# Patient Record
Sex: Male | Born: 1990 | Race: White | Hispanic: No | Marital: Single | Smoking: Current every day smoker
Health system: Southern US, Community
[De-identification: ages and names within clinical notes are randomized; demographics above are authoritative.]

---
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
## 2012-03-18 ENCOUNTER — Emergency Department (HOSPITAL_COMMUNITY)
Admission: EM | Admit: 2012-03-18 | Discharge: 2012-03-18 | Disposition: A | Discharge status: Home / Self Care | Payer: Self-pay | Attending: Emergency Medicine | Admitting: Emergency Medicine

## 2012-03-18 ENCOUNTER — Encounter (HOSPITAL_COMMUNITY): Payer: Self-pay | Admitting: *Deleted

## 2012-03-18 DIAGNOSIS — B86 Scabies: Secondary | ICD-10-CM | POA: Insufficient documentation

## 2012-03-18 DIAGNOSIS — F17200 Nicotine dependence, unspecified, uncomplicated: Secondary | ICD-10-CM | POA: Insufficient documentation

## 2012-03-18 MED ORDER — PERMETHRIN 5 % EX CREA: TOPICAL_CREAM | CUTANEOUS | Status: AC

## 2012-03-18 NOTE — ED Notes (Signed)
Pt reports having rash to body for 2 months, has been itching.

## 2012-03-18 NOTE — ED Provider Notes (Signed)
History     CSN: [id number]  Arrival date & time 03/18/12  1336   First MD Initiated Contact with Patient 03/18/12 1417      Chief Complaint  Patient presents with  . Rash    HPI Joel Simon is a 21 y.o. male who presents to the ED with a rash. The rash started 3 months ago. The rash started between the fingers and has spread. Complains of severe itching.  States his brother has the same rash. His girlfriend had the same rash but went to the doctor and got treated for scabies. Patient denies any other problems.The history was provided by the patient.  History reviewed. No pertinent past medical history.  History reviewed. No pertinent past surgical history.  History reviewed. No pertinent family history.  History  Substance Use Topics  . Smoking status: Current Every Day Smoker  . Smokeless tobacco: Not on file  . Alcohol Use: Yes     rarely      Review of Systems: As stated in HPI  Allergies  Review of patient's allergies indicates no known allergies.  Home Medications  No current outpatient prescriptions on file.  BP 119/74  Pulse 86  Temp 99 F (37.2 C) (Oral)  Resp 18  Ht 6\' 3"  (1.905 m)  Wt 160 lb (72.576 kg)  BMI 20.00 kg/m2  SpO2 99%  Physical Exam  Nursing note and vitals reviewed. Constitutional: He is oriented to person, place, and time. He appears well-developed and well-nourished.  HENT:  Head: Normocephalic.  Eyes: EOM are normal.  Neck: Neck supple.  Cardiovascular: Normal rate.   Pulmonary/Chest: Effort normal.  Musculoskeletal:       See skin exam.  Neurological: He is alert and oriented to person, place, and time.  Skin: Skin is warm and dry. Rash noted.       Linear fine rash noted between fingers, posterior aspect of arms and few area on the back.  Psychiatric: He has a normal mood and affect. His behavior is normal. Judgment and thought content normal.   Assessment: Scabies  Plan:  Elimite   Follow up with Dr. Margo Aye if rash  persist  ED Course  Procedures         [facility], NP 03/18/12 1437

## 2012-03-18 NOTE — ED Notes (Signed)
Itching rash for 3 mos, exposed to scabies

## 2012-03-19 NOTE — ED Provider Notes (Signed)
Medical screening examination/treatment/procedure(s) were performed by non-physician practitioner and as supervising physician I was immediately available for consultation/collaboration.   Samantha Ragen E Elwin Tsou, MD 03/19/12 1439 

## 2012-10-15 ENCOUNTER — Emergency Department (HOSPITAL_COMMUNITY): Payer: Self-pay

## 2012-10-15 ENCOUNTER — Emergency Department (HOSPITAL_COMMUNITY)
Admission: EM | Admit: 2012-10-15 | Discharge: 2012-10-15 | Disposition: A | Discharge status: Home / Self Care | Payer: Self-pay | Attending: Emergency Medicine | Admitting: Emergency Medicine

## 2012-10-15 ENCOUNTER — Encounter (HOSPITAL_COMMUNITY): Payer: Self-pay | Admitting: *Deleted

## 2012-10-15 DIAGNOSIS — S52509A Unspecified fracture of the lower end of unspecified radius, initial encounter for closed fracture: Secondary | ICD-10-CM | POA: Insufficient documentation

## 2012-10-15 DIAGNOSIS — F17200 Nicotine dependence, unspecified, uncomplicated: Secondary | ICD-10-CM | POA: Insufficient documentation

## 2012-10-15 DIAGNOSIS — S5291XA Unspecified fracture of right forearm, initial encounter for closed fracture: Secondary | ICD-10-CM

## 2012-10-15 DIAGNOSIS — Y9289 Other specified places as the place of occurrence of the external cause: Secondary | ICD-10-CM | POA: Insufficient documentation

## 2012-10-15 DIAGNOSIS — S52201A Unspecified fracture of shaft of right ulna, initial encounter for closed fracture: Secondary | ICD-10-CM

## 2012-10-15 DIAGNOSIS — W1789XA Other fall from one level to another, initial encounter: Secondary | ICD-10-CM | POA: Insufficient documentation

## 2012-10-15 DIAGNOSIS — S52609A Unspecified fracture of lower end of unspecified ulna, initial encounter for closed fracture: Secondary | ICD-10-CM | POA: Insufficient documentation

## 2012-10-15 DIAGNOSIS — Y939 Activity, unspecified: Secondary | ICD-10-CM | POA: Insufficient documentation

## 2012-10-15 MED ORDER — HYDROMORPHONE HCL PF 1 MG/ML IJ SOLN
0.5000 mg | Freq: Once | INTRAMUSCULAR | Status: AC
  Administered 2012-10-15: 0.5 mg via INTRAVENOUS
  Filled 2012-10-15: qty 1

## 2012-10-15 MED ORDER — IBUPROFEN 800 MG PO TABS: 800.0000 mg | ORAL_TABLET | Freq: Three times a day (TID) | ORAL | Status: AC

## 2012-10-15 MED ORDER — HYDROCODONE-ACETAMINOPHEN 5-325 MG PO TABS
1.0000 | ORAL_TABLET | Freq: Four times a day (QID) | ORAL | Status: AC | PRN
Start: 2012-10-15 — End: ?

## 2012-10-15 MED ORDER — ONDANSETRON HCL 4 MG/2ML IJ SOLN
4.0000 mg | Freq: Once | INTRAMUSCULAR | Status: AC
  Administered 2012-10-15: 4 mg via INTRAVENOUS
  Filled 2012-10-15: qty 2

## 2012-10-15 MED ORDER — OXYCODONE-ACETAMINOPHEN 5-325 MG PO TABS
1.0000 | ORAL_TABLET | Freq: Once | ORAL | Status: AC
  Administered 2012-10-15: 1 via ORAL
  Filled 2012-10-15: qty 1

## 2012-10-15 NOTE — ED Notes (Signed)
Fell approximately 25 feet down steep embankment.  Stuck R arm out to brace fall. Deformity medial R wrist.

## 2012-10-15 NOTE — ED Provider Notes (Signed)
History  This chart was scribed for Shelda Jakes, MD by Erskine Emery, ED Scribe. This patient was seen in room APA12/APA12 and the patient's care was started at 15:10.   CSN: [id number]  Arrival date & time 10/15/12  1415   First MD Initiated Contact with Patient 10/15/12 1510      Chief Complaint  Patient presents with  . Arm Pain  . Fall    (Consider location/radiation/quality/duration/timing/severity/associated sxs/prior treatment) The history is provided by the patient. No language interpreter was used.  Joel Simon is a 22 y.o. male who presents to the Emergency Department complaining of right medial wrist deformity and 10/10 pain since just PTA when he fell approximately 25 feet down a steep embankment. Pt denies any associated LOC, head pain, neck pain, chest pain, abdominal pain, lower extremity pain, fever, chills, visual changes, cough, rhinorrhea, congestion, SOB, nausea, emesis, diarrhea, dysuria, hematuria, back pain, leg swelling, h/o bleeding easily, rash, headache, weakness or numbness in his extremities, confusion, or previous injury to his right arm. Pt reports some right shoulder pain earlier but claims that has passed. He claims the pain medication he was given upon arrival has helped to relieve his pain considerably.  History reviewed. No pertinent past medical history.  History reviewed. No pertinent past surgical history.  History reviewed. No pertinent family history.  History  Substance Use Topics  . Smoking status: Current Every Day Smoker  . Smokeless tobacco: Not on file  . Alcohol Use: Yes     Comment: rarely      Review of Systems  Constitutional: Negative for fever and chills.  HENT: Negative for congestion, sore throat, rhinorrhea and neck pain.   Eyes: Negative for visual disturbance.  Respiratory: Negative for cough.   Cardiovascular: Negative for chest pain.  Gastrointestinal: Negative for nausea, vomiting, abdominal pain and  diarrhea.  Genitourinary: Negative for dysuria and hematuria.  Musculoskeletal: Negative for back pain and joint swelling.       Right medial wrist deformity and pain.  Skin: Negative for rash.  Neurological: Negative for syncope, weakness, numbness and headaches.  Hematological: Does not bruise/bleed easily.  Psychiatric/Behavioral: Negative for confusion.  All other systems reviewed and are negative.    Allergies  Review of patient's allergies indicates no known allergies.  Home Medications   Current Outpatient Rx  Name  Route  Sig  Dispense  Refill  . HYDROcodone-acetaminophen (NORCO/VICODIN) 5-325 MG per tablet   Oral   Take 1-2 tablets by mouth every 6 (six) hours as needed for pain.   20 tablet   0   . ibuprofen (ADVIL,MOTRIN) 800 MG tablet   Oral   Take 1 tablet (800 mg total) by mouth 3 (three) times daily.   21 tablet   0     Triage Vitals: BP 106/64  Pulse 72  Temp(Src) 99.1 F (37.3 C) (Oral)  Resp 16  Ht 6\' 3"  (1.905 m)  Wt 165 lb (74.844 kg)  BMI 20.62 kg/m2  SpO2 100%  Physical Exam  Nursing note and vitals reviewed. Constitutional: He is oriented to person, place, and time. He appears well-developed and well-nourished. No distress.  HENT:  Head: Normocephalic and atraumatic.  Mouth/Throat: Oropharynx is clear and moist.  Eyes: EOM are normal. Pupils are equal, round, and reactive to light.  Neck: Normal range of motion. Neck supple. No tracheal deviation present.  No neck tenderness.  Cardiovascular: Normal rate, regular rhythm and normal heart sounds.   No murmur heard. Pulmonary/Chest:  Effort normal. No respiratory distress.  Abdominal: Soft. Bowel sounds are normal. He exhibits no distension. There is no tenderness.  Musculoskeletal: Normal range of motion. He exhibits no edema and no tenderness.  Right thumb: capillary refill is 1 second. Brachial pulse is 2+ in right arm, radial pulse is 2+. Swelling over radial and ulnar aspects of right  wrist. Normal sensation. No back tenderness.  Neurological: He is alert and oriented to person, place, and time. No cranial nerve deficit. Coordination normal.  Skin: Skin is warm and dry.  Psychiatric: He has a normal mood and affect.    ED Course  Procedures (including critical care time) DIAGNOSTIC STUDIES: Oxygen Saturation is 100% on room air, normal by my interpretation.    COORDINATION OF CARE: 15:17--I evaluated the patient and we discussed a treatment plan including x-rays and possible follow up with orthopedics to which the pt agreed.    15:41--I rechecked the pt and notified him that he fractured his radius and ulnar bone. Pt has no orthopedic doctor. I notified him that it would take about 6-8 weeks of healing and that he should elevate the arm and wear a sling. I explained that we would write him for some pain medication to use as needed.   Labs Reviewed - No data to display Dg Pelvis 1-2 Views  10/15/2012  *RADIOLOGY REPORT*  Clinical Data: Fall.  PELVIS - 1-2 VIEW  Comparison: None.  Findings: Single view of the pelvis was obtained.  The pelvic bony ring is intact.  Symmetric appearance of the sacroiliac joints.  No gross abnormality to either hip.  IMPRESSION: No acute bony abnormality.   Original Report Authenticated By: Richarda Overlie, M.D.    Dg Wrist Complete Right  10/15/2012  *RADIOLOGY REPORT*  Clinical Data: Fall, wrist pain/swelling  RIGHT WRIST - COMPLETE 3+ VIEW  Comparison: None.  Findings: Comminuted distal radial fracture with intra-articular extension, nondisplaced.  Minimally displaced ulnar styloid fracture.  Associated soft tissue swelling.  IMPRESSION: Distal radial fracture with interarticular extension.  Ulnar styloid fracture.   Original Report Authenticated By: Charline Bills, M.D.    Ct Head Wo Contrast  10/15/2012  *RADIOLOGY REPORT*  Clinical Data:  Fall, neck pain  CT HEAD WITHOUT CONTRAST CT CERVICAL SPINE WITHOUT CONTRAST  Technique:  Multidetector  CT imaging of the head and cervical spine was performed following the standard protocol without intravenous contrast.  Multiplanar CT image reconstructions of the cervical spine were also generated.  Comparison:  None.  CT HEAD  Findings: No evidence of parenchymal hemorrhage or extra-axial fluid collection. No mass lesion, mass effect, or midline shift.  No CT evidence of acute infarction.  Cerebral volume is age appropriate.  No ventriculomegaly.  The visualized paranasal sinuses are essentially clear. The mastoid air cells are unopacified.  No evidence of calvarial fracture.  IMPRESSION: Normal head CT.  CT CERVICAL SPINE  Findings: Normal cervical lordosis.  No evidence of fracture dislocation.  Vertebral body heights and intervertebral disc spaces are maintained.  Dens appears intact.  No prevertebral soft tissue swelling.  Visualized thyroid is unremarkable.  Visualized lung apices are clear.  IMPRESSION: Normal cervical spine CT.   Original Report Authenticated By: Charline Bills, M.D.    Ct Cervical Spine Wo Contrast  10/15/2012  *RADIOLOGY REPORT*  Clinical Data:  Fall, neck pain  CT HEAD WITHOUT CONTRAST CT CERVICAL SPINE WITHOUT CONTRAST  Technique:  Multidetector CT imaging of the head and cervical spine was performed following the standard protocol without  intravenous contrast.  Multiplanar CT image reconstructions of the cervical spine were also generated.  Comparison:  None.  CT HEAD  Findings: No evidence of parenchymal hemorrhage or extra-axial fluid collection. No mass lesion, mass effect, or midline shift.  No CT evidence of acute infarction.  Cerebral volume is age appropriate.  No ventriculomegaly.  The visualized paranasal sinuses are essentially clear. The mastoid air cells are unopacified.  No evidence of calvarial fracture.  IMPRESSION: Normal head CT.  CT CERVICAL SPINE  Findings: Normal cervical lordosis.  No evidence of fracture dislocation.  Vertebral body heights and intervertebral  disc spaces are maintained.  Dens appears intact.  No prevertebral soft tissue swelling.  Visualized thyroid is unremarkable.  Visualized lung apices are clear.  IMPRESSION: Normal cervical spine CT.   Original Report Authenticated By: Charline Bills, M.D.      1. Radius fracture, right, closed, initial encounter   2. Ulnar fracture, right, closed, initial encounter       MDM  X-rays negative except for the right forearm with a distal radius fracture and an ulnar styloid fracture. Will splint with a sugar tong splint in the ED provided sling recommend elevation and referral to orthopedics Dr. Hilda Lias is on-call. Other x-rays were negative also on physical exam no other concerns for any significant injury.      I personally performed the services described in this documentation, which was scribed in my presence. The recorded information has been reviewed and is accurate.     Shelda Jakes, MD 10/15/12 1550

## 2013-09-06 IMAGING — CT CT CERVICAL SPINE W/O CM
4 of 5 series · 15 of 33 positions shown, 17 images · non-contrast
Comparison: None.

CT HEAD

CLINICAL DATA: Fall, neck pain

CT HEAD WITHOUT CONTRAST
CT CERVICAL SPINE WITHOUT CONTRAST
TECHNIQUE: Multidetector CT imaging of the head and cervical spine
was performed following the standard protocol without intravenous
contrast.  Multiplanar CT image reconstructions of the cervical
spine were also generated.

[Series 5: cervical st 2.0 b31s · axial · 0.36mm/px · z∈[-48,+66]mm · 4 of 95 slices shown, 5 images]
[im 19/95  soft-tissue]
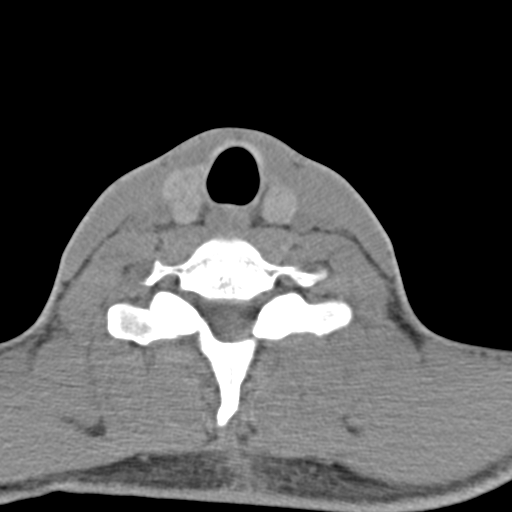
[im 19/95  bone]
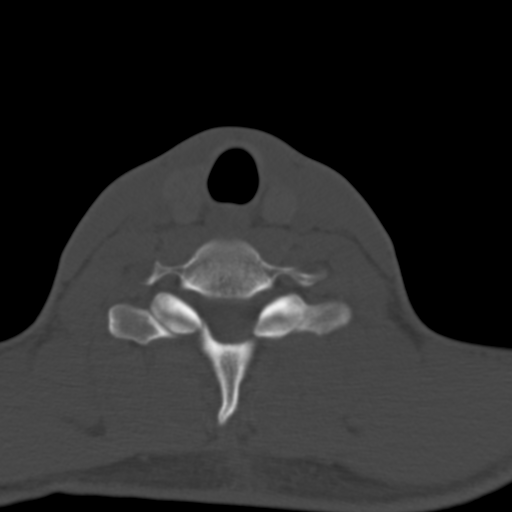
[im 38/95  bone]
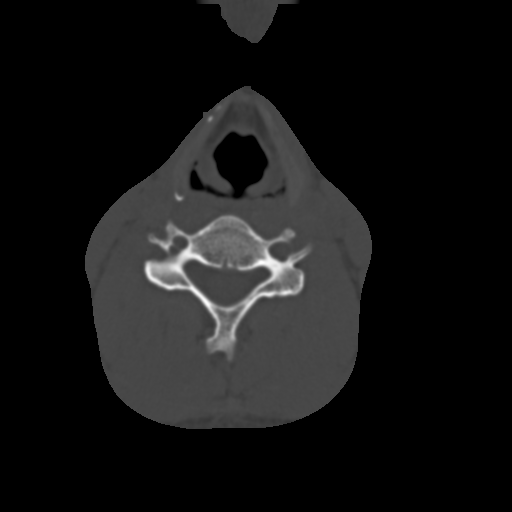
[im 57/95  bone]
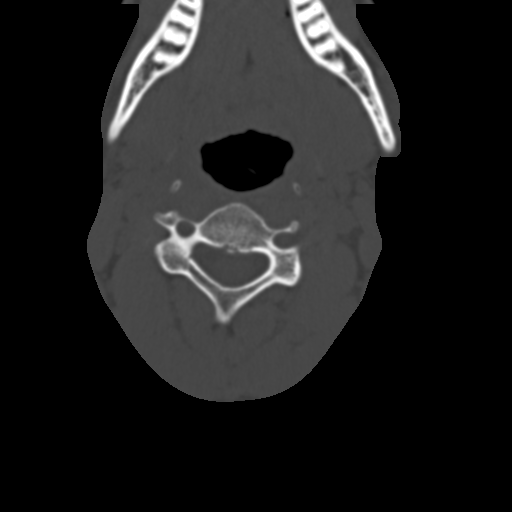
[im 76/95  bone]
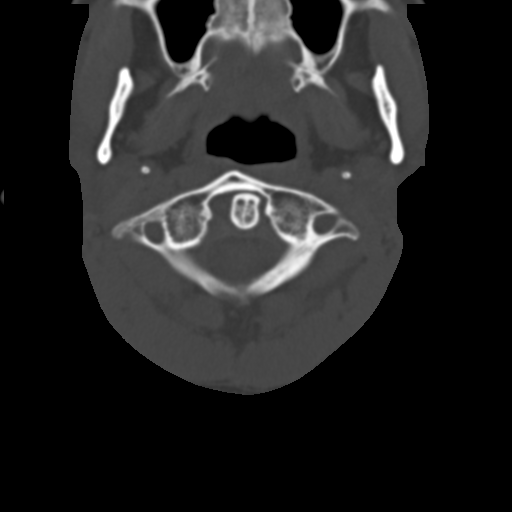

[Series 7: sagittal bone 2.0 · sagittal · 0.31mm/px · 5 of 60 slices shown, 6 images]
[im 20/60  bone]
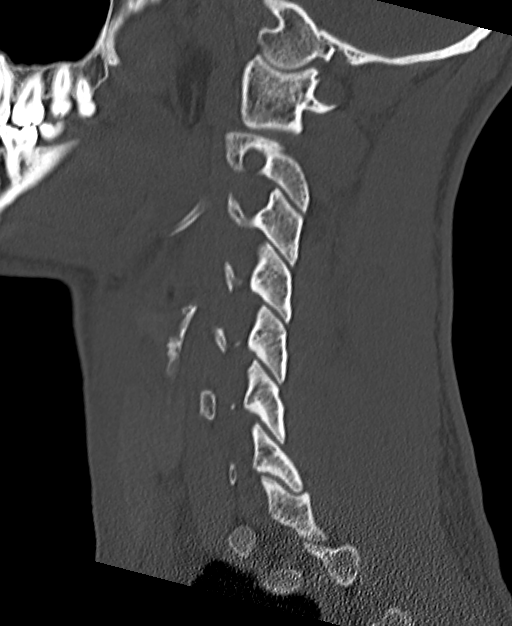
[im 25/60  bone]
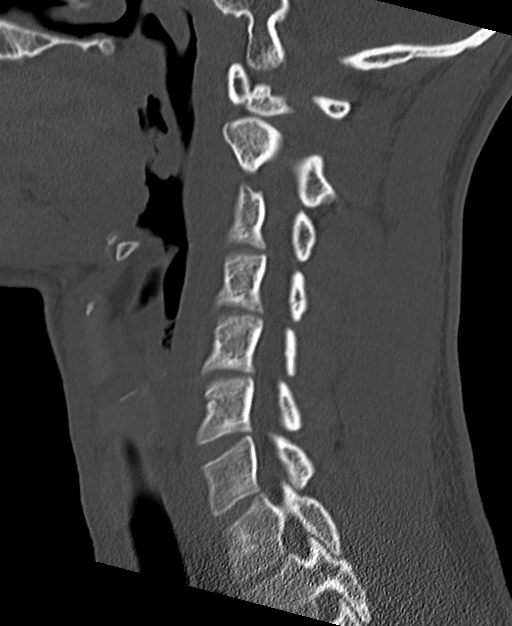
[im 30/60  soft-tissue]
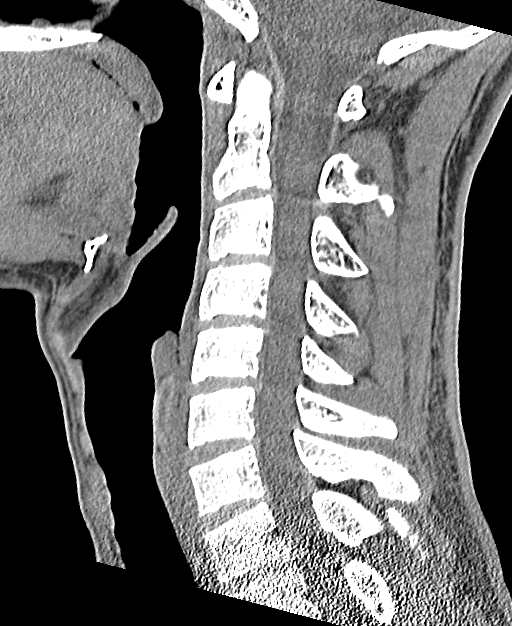
[im 30/60  bone]
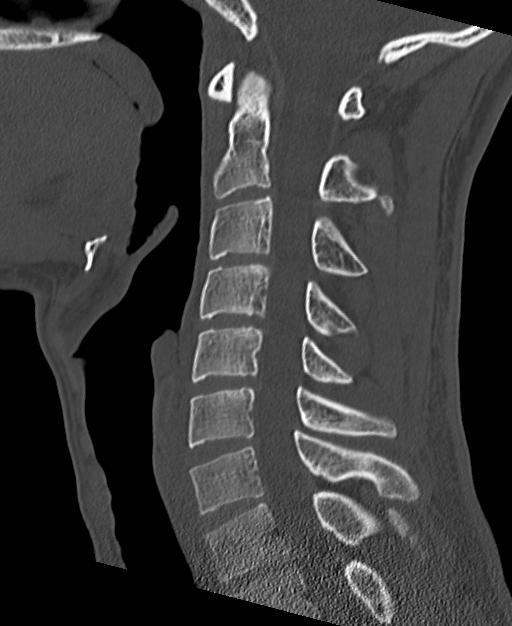
[im 35/60  bone]
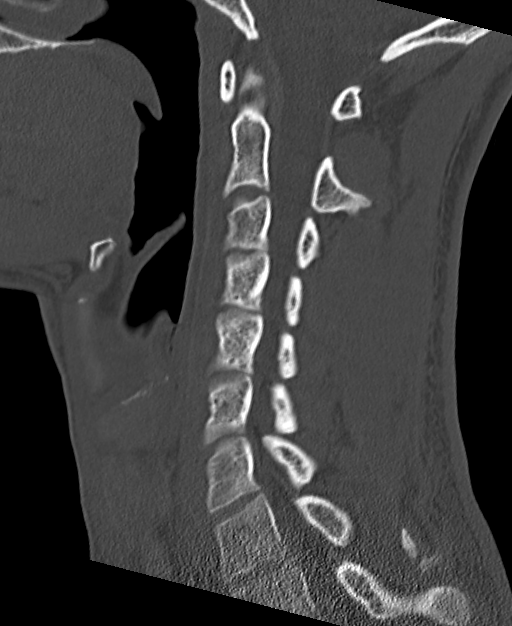
[im 40/60  bone]
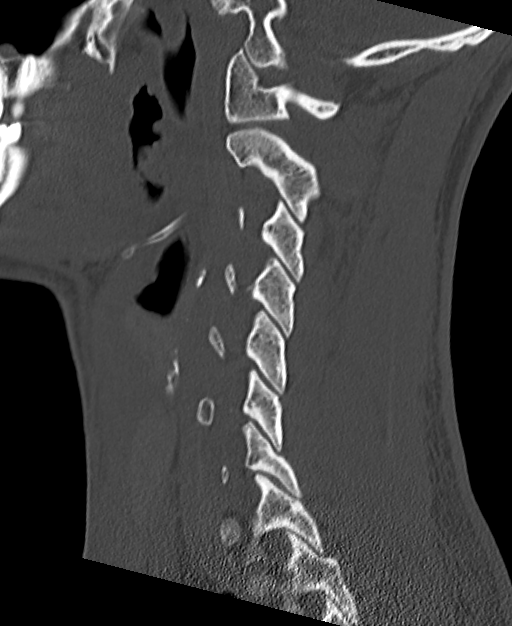

[Series 8: coronal bone 2.0 · coronal · 0.39mm/px · 3 of 79 slices shown]
[im 17/79  bone]
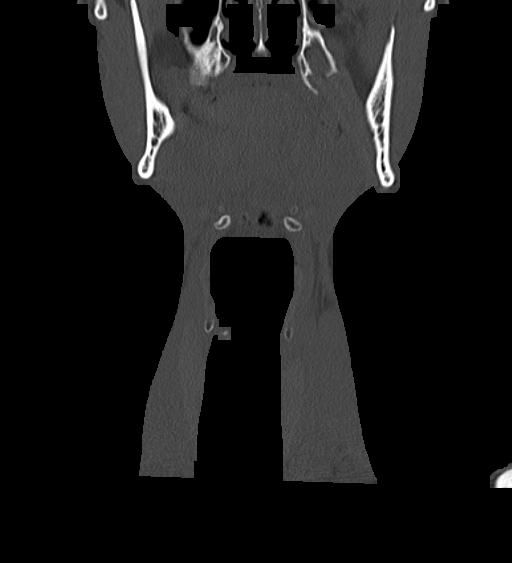
[im 32/79  bone]
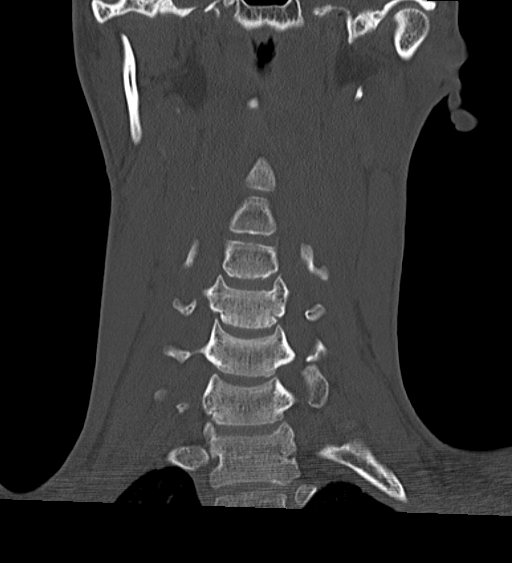
[im 47/79  bone]
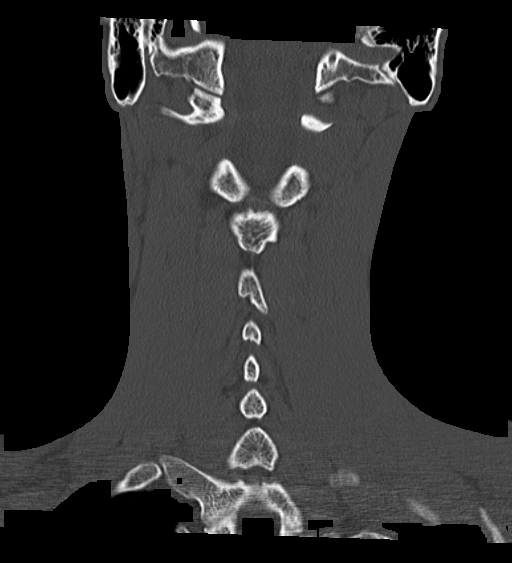

[Series 9: axial bone 2.0 · axial · 0.22mm/px · z∈[-63,+12]mm · 3 of 99 slices shown]
[im 20/99  bone]
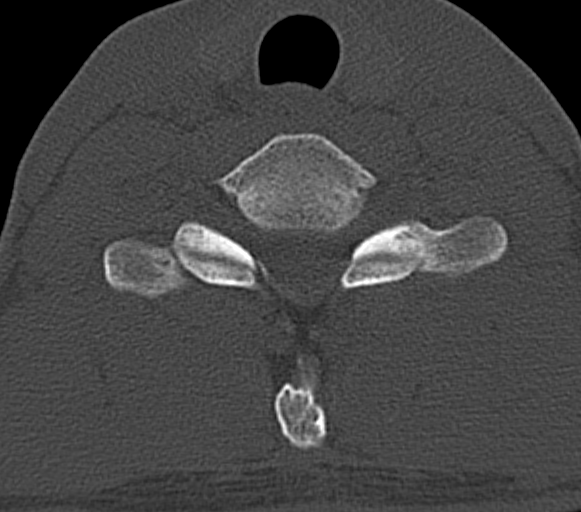
[im 40/99  bone]
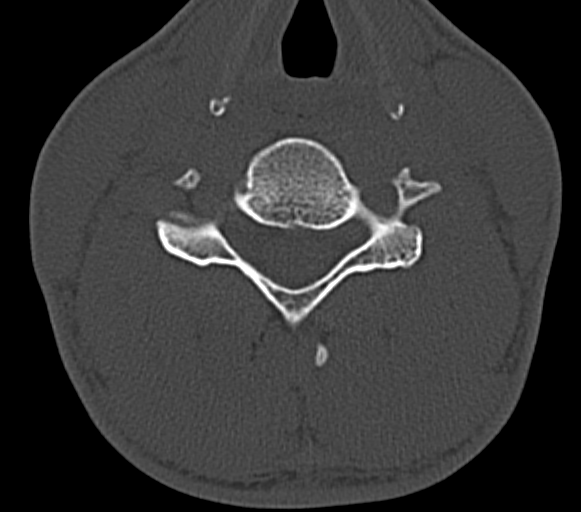
[im 59/99  bone]
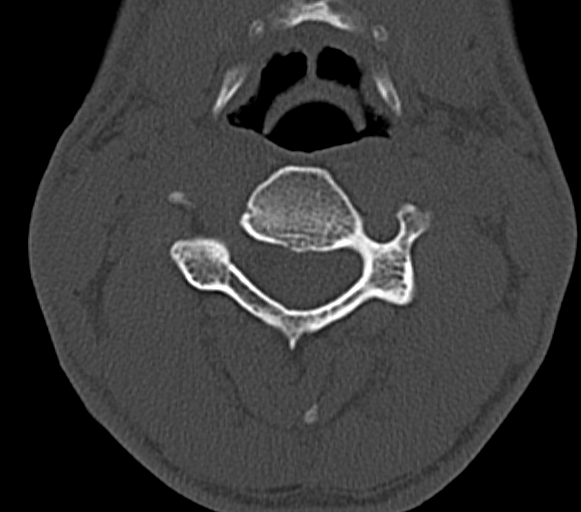

[15 of 33 positions shown; findings below may reference images not displayed]

FINDINGS: No evidence of parenchymal hemorrhage or extra-axial
fluid collection. No mass lesion, mass effect, or midline shift.

No CT evidence of acute infarction.

Cerebral volume is age appropriate.  No ventriculomegaly.

The visualized paranasal sinuses are essentially clear. The mastoid
air cells are unopacified.

No evidence of calvarial fracture.
IMPRESSION: Normal head CT.

CT CERVICAL SPINE
FINDINGS: Normal cervical lordosis.

No evidence of fracture dislocation.  Vertebral body heights and
intervertebral disc spaces are maintained.  Dens appears intact.

No prevertebral soft tissue swelling.

Visualized thyroid is unremarkable.

Visualized lung apices are clear.
IMPRESSION: Normal cervical spine CT.

## 2014-05-29 ENCOUNTER — Encounter (HOSPITAL_COMMUNITY): Payer: Self-pay | Admitting: Emergency Medicine

## 2014-05-29 ENCOUNTER — Emergency Department (HOSPITAL_COMMUNITY)
Admission: EM | Admit: 2014-05-29 | Discharge: 2014-05-29 | Disposition: A | Discharge status: Home / Self Care | Payer: Self-pay | Attending: Emergency Medicine | Admitting: Emergency Medicine

## 2014-05-29 DIAGNOSIS — Z720 Tobacco use: Secondary | ICD-10-CM | POA: Insufficient documentation

## 2014-05-29 DIAGNOSIS — B86 Scabies: Secondary | ICD-10-CM | POA: Insufficient documentation

## 2014-05-29 MED ORDER — DIPHENHYDRAMINE HCL 25 MG PO CAPS
25.0000 mg | ORAL_CAPSULE | Freq: Once | ORAL | Status: AC
  Administered 2014-05-29: 25 mg via ORAL
  Filled 2014-05-29: qty 1

## 2014-05-29 MED ORDER — DIPHENHYDRAMINE HCL 25 MG PO TABS: 25.0000 mg | ORAL_TABLET | ORAL | Status: AC | PRN

## 2014-05-29 MED ORDER — PERMETHRIN 5 % EX CREA: TOPICAL_CREAM | CUTANEOUS | Status: AC

## 2014-05-29 MED ORDER — PREDNISONE 50 MG PO TABS
60.0000 mg | ORAL_TABLET | Freq: Once | ORAL | Status: AC
  Administered 2014-05-29: 60 mg via ORAL
  Filled 2014-05-29 (×2): qty 1

## 2014-05-29 NOTE — Care Management Note (Signed)
ED/CM noted patient did not have health insurance and/or PCP listed in the computer.  Patient was given the [place] resource handout with information on the clinics, food pantries, and the handout for new health insurance sign-up. Pt was also given a Rx discount card. Patient expressed appreciation for information received. 

## 2014-05-29 NOTE — ED Notes (Signed)
Patient c/o rash to arms, neck, legs, and feet. Per patient itching. Denies any fevers. Per patient using coconut oil with no relief. Patient denies any new medications, food, soaps, or detergents. Per patient when it first started was donating plasma.

## 2014-05-29 NOTE — Discharge Instructions (Signed)

## 2014-05-30 LAB — RPR

## 2014-06-01 NOTE — ED Provider Notes (Signed)
CSN: [id number]     Arrival date & time 05/29/14  1157 History   First MD Initiated Contact with Patient 05/29/14 1248     Chief Complaint  Patient presents with  . Rash     (Consider location/radiation/quality/duration/timing/severity/associated sxs/prior Treatment) HPI  Joel Simon is a 23 y.o. male who presents to the Emergency Department complaining of rash of nearly one month.  He states the rash appears to be worsening.  He has been using OTC anti-itch medications w/o relief.  He states that his friend also has similar symptoms.  He denies new medications or foods, fever, swelling, difficulty swallowing or breathing.,   History reviewed. No pertinent past medical history. History reviewed. No pertinent past surgical history. History reviewed. No pertinent family history. History  Substance Use Topics  . Smoking status: Current Every Day Smoker -- 0.75 packs/day for 3 years    Types: Cigarettes  . Smokeless tobacco: Never Used  . Alcohol Use: Yes     Comment: rarely    Review of Systems  Constitutional: Negative for fever, chills, activity change and appetite change.  HENT: Negative for facial swelling, sore throat and trouble swallowing.   Respiratory: Negative for cough, chest tightness, shortness of breath and wheezing.   Musculoskeletal: Negative for neck pain and neck stiffness.  Skin: Positive for rash. Negative for wound.  Neurological: Negative for dizziness, weakness, numbness and headaches.  All other systems reviewed and are negative.     Allergies  Review of patient's allergies indicates no known allergies.  Home Medications   Prior to Admission medications   Medication Sig Start Date End Date Taking? Authorizing Provider  diphenhydrAMINE (BENADRYL) 25 MG tablet Take 1 tablet (25 mg total) by mouth every 4 (four) hours as needed for itching. 05/29/14   Denilson Salminen L. Lessie Funderburke, PA-C  HYDROcodone-acetaminophen (NORCO/VICODIN) 5-325 MG per tablet Take  1-2 tablets by mouth every 6 (six) hours as needed for pain. 10/15/12   Vanetta MuldersScott Zackowski, MD  ibuprofen (ADVIL,MOTRIN) 800 MG tablet Take 1 tablet (800 mg total) by mouth 3 (three) times daily. 10/15/12   Vanetta MuldersScott Zackowski, MD  permethrin (ELIMITE) 5 % cream Apply to most of the body, leave on for 8-10 hrs then was off.  May re-apply in 7 days if needed 05/29/14   [place] Giammona L. Arelia Volpe, PA-C   BP 136/85 mmHg  Pulse 79  Temp(Src) 97.9 F (36.6 C) (Oral)  Resp 16  Ht 6\' 3"  (1.905 m)  Wt 165 lb (74.844 kg)  BMI 20.62 kg/m2  SpO2 100% Physical Exam  Constitutional: He is oriented to person, place, and time. He appears well-developed and well-nourished. No distress.  HENT:  Head: Normocephalic and atraumatic.  Mouth/Throat: Oropharynx is clear and moist.  Neck: Normal range of motion. Neck supple.  Cardiovascular: Normal rate, regular rhythm, normal heart sounds and intact distal pulses.   No murmur heard. Pulmonary/Chest: Effort normal and breath sounds normal. No respiratory distress.  Musculoskeletal: He exhibits no edema or tenderness.  Lymphadenopathy:    He has no cervical adenopathy.  Neurological: He is alert and oriented to person, place, and time. He exhibits normal muscle tone. Coordination normal.  Skin: Skin is warm. Rash noted. There is erythema.  Scattered, small erythematous papules to the most of the body including web spaces of his hands.  No pustules or vesicles.  Nursing note and vitals reviewed.   ED Course  Procedures (including critical care time) Labs Review Labs Reviewed  RPR    Imaging Review  No results found.   EKG Interpretation None      MDM   Final diagnoses:  Scabies    Pt is well appearing, rash appears c/w scabies.  He agrees to tx with permethrin and benadryl   Lucette Kratz L. Trisha Mangleriplett, PA-C 06/01/14 1625  Donnetta HutchingBrian Cook, MD 06/02/14 1055
